# Patient Record
Sex: Male | Born: 1965
Health system: Southern US, Community
[De-identification: ages and names within clinical notes are randomized; demographics above are authoritative.]

---

## 2005-10-10 ENCOUNTER — Encounter: Admission: RE | Admit: 2005-10-10 | Discharge: 2005-10-10 | Payer: Self-pay | Admitting: Orthopedic Surgery

## 2012-10-11 ENCOUNTER — Ambulatory Visit
Admission: RE | Admit: 2012-10-11 | Discharge: 2012-10-11 | Disposition: A | Discharge status: Home / Self Care | Payer: 59 | Source: Ambulatory Visit | Attending: Family Medicine | Admitting: Family Medicine

## 2012-10-11 ENCOUNTER — Other Ambulatory Visit: Payer: Self-pay | Admitting: Family Medicine

## 2012-10-11 DIAGNOSIS — R05 Cough: Secondary | ICD-10-CM

## 2012-10-11 DIAGNOSIS — R509 Fever, unspecified: Secondary | ICD-10-CM

## 2012-10-11 DIAGNOSIS — R059 Cough, unspecified: Secondary | ICD-10-CM

## 2013-12-23 IMAGING — CR DG CHEST 2V
2 series · 2 of 2 positions shown · non-contrast
Comparison: None.

CLINICAL DATA: Cough and shortness of breath.  Right mid lung
rales.

CHEST - 2 VIEW

[w chest pa]
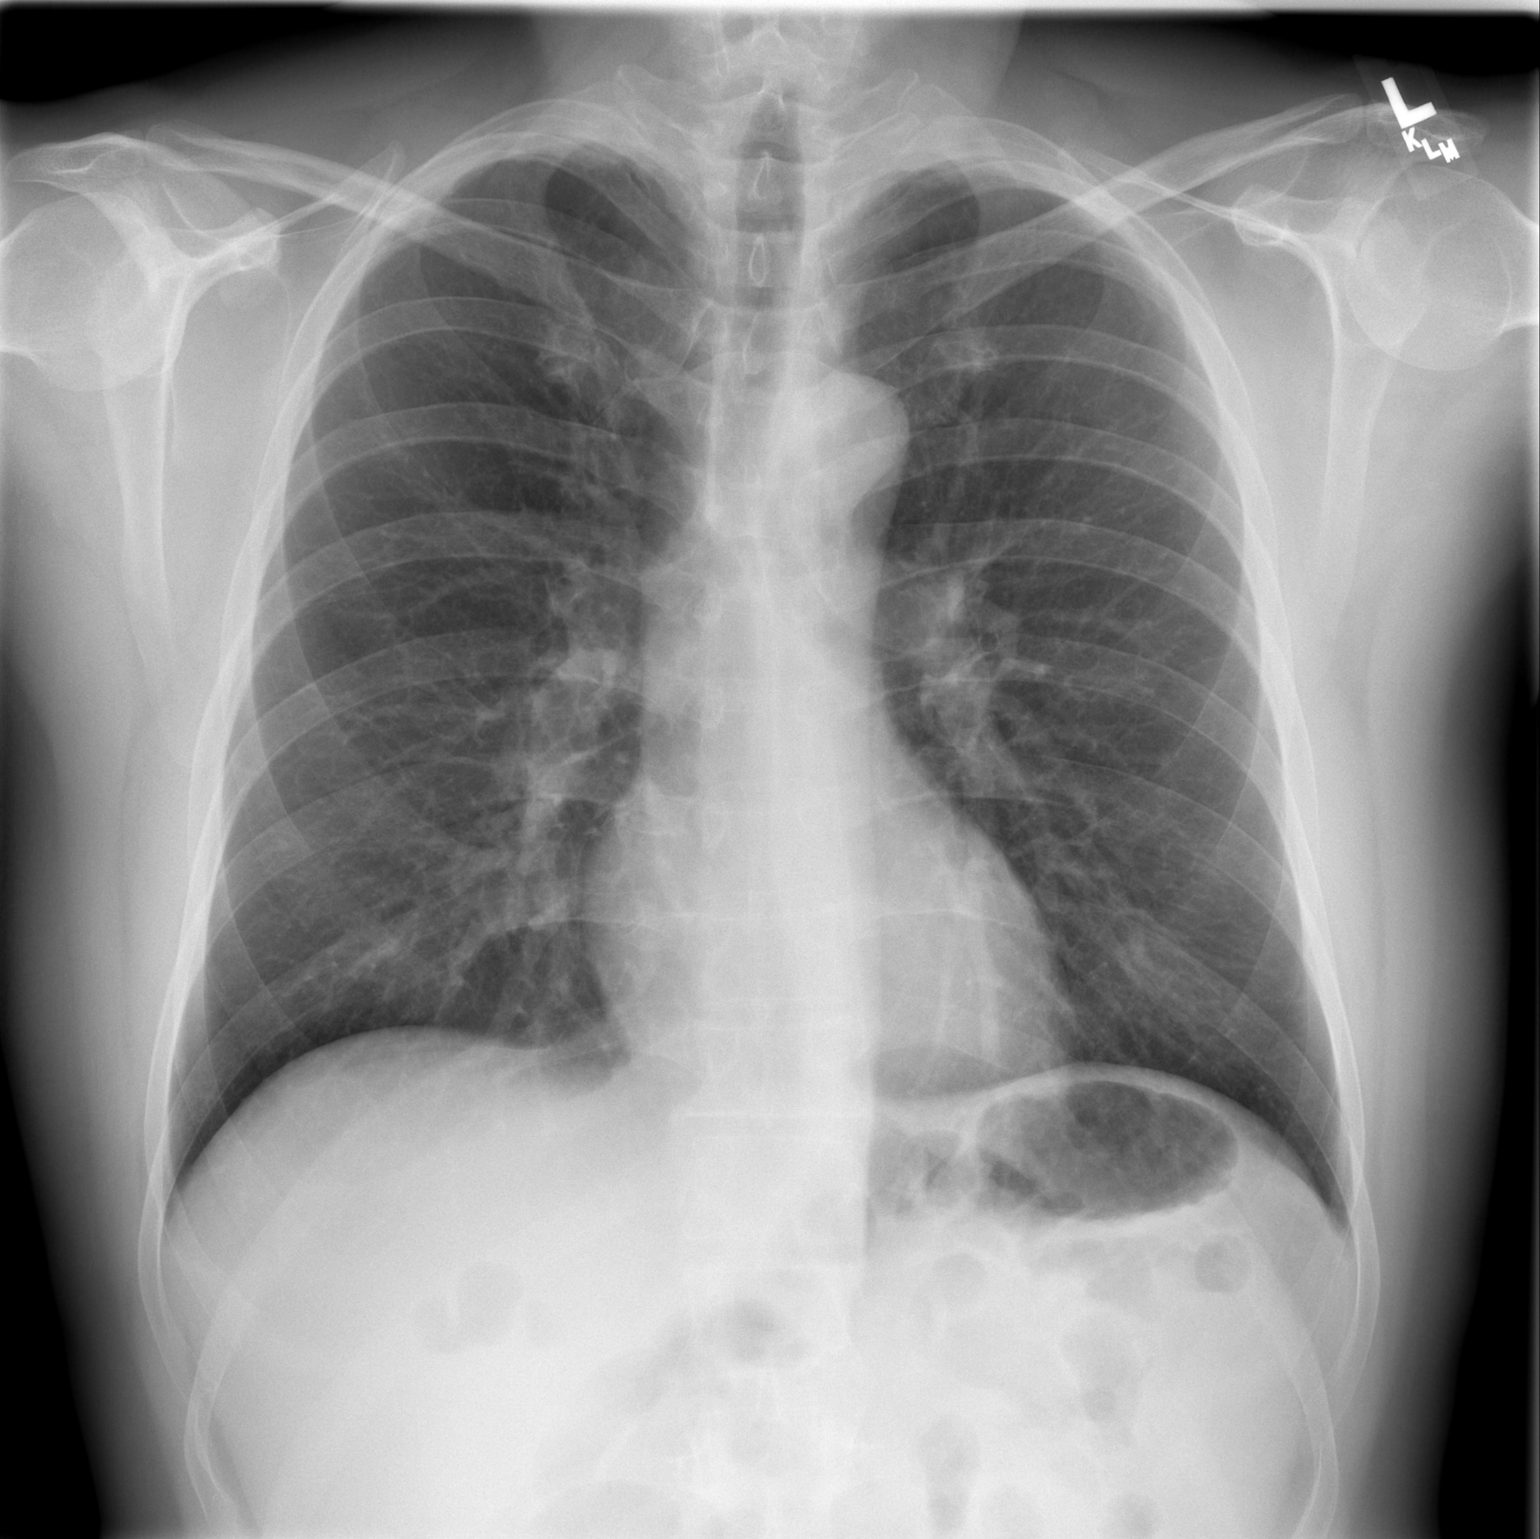

[w chest lat]
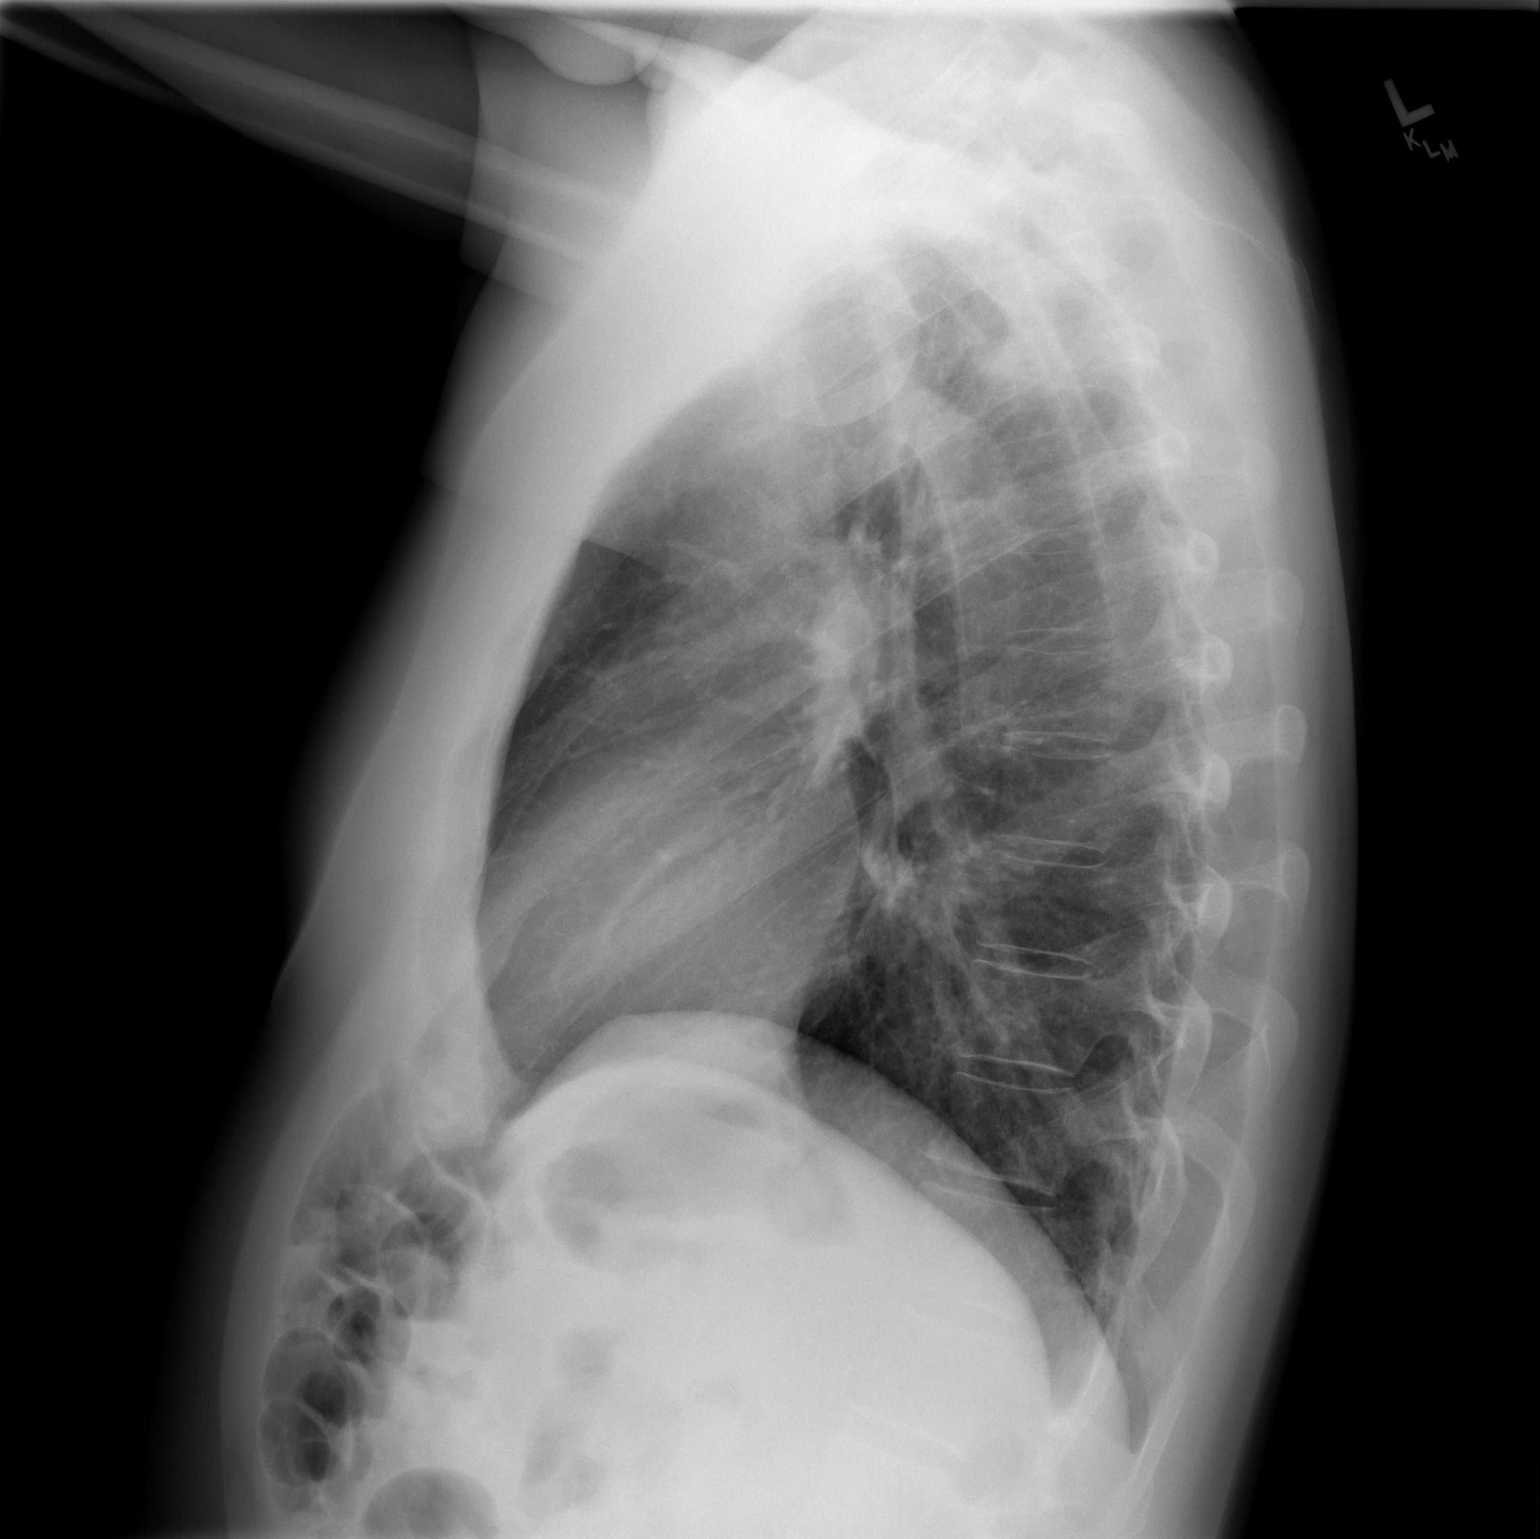

[2 of 2 positions shown; findings below may reference images not displayed]

FINDINGS: Trachea is midline.  Heart size normal.  Minimal biapical
pleural thickening.  Lungs are otherwise clear.  No pleural fluid.
IMPRESSION: No acute findings.

## 2016-06-17 DIAGNOSIS — M5416 Radiculopathy, lumbar region: Secondary | ICD-10-CM | POA: Diagnosis not present

## 2016-06-17 DIAGNOSIS — M545 Low back pain: Secondary | ICD-10-CM | POA: Diagnosis not present

## 2016-06-17 DIAGNOSIS — M5136 Other intervertebral disc degeneration, lumbar region: Secondary | ICD-10-CM | POA: Diagnosis not present

## 2016-08-07 DIAGNOSIS — H02825 Cysts of left lower eyelid: Secondary | ICD-10-CM | POA: Diagnosis not present

## 2016-11-15 DIAGNOSIS — J028 Acute pharyngitis due to other specified organisms: Secondary | ICD-10-CM | POA: Diagnosis not present

## 2016-11-15 DIAGNOSIS — J020 Streptococcal pharyngitis: Secondary | ICD-10-CM | POA: Diagnosis not present

## 2017-05-18 DIAGNOSIS — J069 Acute upper respiratory infection, unspecified: Secondary | ICD-10-CM | POA: Diagnosis not present

## 2017-05-19 DIAGNOSIS — H6123 Impacted cerumen, bilateral: Secondary | ICD-10-CM | POA: Diagnosis not present

## 2017-06-16 ENCOUNTER — Encounter (HOSPITAL_COMMUNITY): Payer: Self-pay

## 2017-06-16 ENCOUNTER — Emergency Department (HOSPITAL_COMMUNITY)
Admission: EM | Admit: 2017-06-16 | Discharge: 2017-06-16 | Disposition: A | Discharge status: Home / Self Care | Payer: 59 | Attending: Emergency Medicine | Admitting: Emergency Medicine

## 2017-06-16 DIAGNOSIS — Z79899 Other long term (current) drug therapy: Secondary | ICD-10-CM | POA: Diagnosis not present

## 2017-06-16 DIAGNOSIS — M545 Low back pain, unspecified: Secondary | ICD-10-CM

## 2017-06-16 MED ORDER — IBUPROFEN 400 MG PO TABS
600.0000 mg | ORAL_TABLET | Freq: Once | ORAL | Status: AC
  Administered 2017-06-16: 600 mg via ORAL
  Filled 2017-06-16: qty 1

## 2017-06-16 MED ORDER — METHOCARBAMOL 500 MG PO TABS
500.0000 mg | ORAL_TABLET | Freq: Once | ORAL | Status: AC
  Administered 2017-06-16: 500 mg via ORAL
  Filled 2017-06-16: qty 1

## 2017-06-16 MED ORDER — METHOCARBAMOL 500 MG PO TABS: 500.0000 mg | ORAL_TABLET | Freq: Every evening | ORAL | 0 refills | Status: AC | PRN

## 2017-06-16 MED ORDER — HYDROMORPHONE HCL 1 MG/ML IJ SOLN
1.0000 mg | Freq: Once | INTRAMUSCULAR | Status: AC
  Administered 2017-06-16: 1 mg via INTRAMUSCULAR
  Filled 2017-06-16: qty 1

## 2017-06-16 MED ORDER — HYDROCODONE-ACETAMINOPHEN 5-325 MG PO TABS: 1.0000 | ORAL_TABLET | ORAL | 0 refills | Status: AC | PRN

## 2017-06-16 NOTE — Discharge Instructions (Signed)
Take Ibuprofen three times daily for the next week. Take this medicine with food. Take pain medicine as needed Take muscle relaxer at bedtime to help you sleep. This medicine makes you drowsy so do not take before driving or work Use a heating pad for sore muscles - use for 20 minutes several times a day Return for worsening symptoms

## 2017-06-16 NOTE — ED Provider Notes (Signed)
[facility] EMERGENCY DEPARTMENT Provider Note   CSN: [id number] Arrival date & time: 06/16/17  1725     History   Chief Complaint Chief Complaint  Patient presents with  . Back Pain    HPI Anthony Burch is a 52 y.o. male who presents with low back pain. Past medical history significant for chronic back pain.  States that he has had severe lower back pain for the past 2 days.  He cannot recall an injury or inciting event.  The pain is similar to his chronic pain that he has had in the past however he has never had pain that has lasted this long or has been this intense.  He has having difficulty walking around his house and using a rolling chair to get around. He also has difficulty sitting up due to muscle spasms. No fever, syncope, trauma, unexplained weight loss, hx of cancer, loss of bowel/bladder function, saddle anesthesia, urinary retention, IVDU.  No abdominal pain or urinary symptoms.  He has been receiving care at a sports medicine clinic and has received steroid injections in the back in the past.  He has not had any surgeries on the back.  He took some hydrocodone and Robaxin however these were out of date and they did not provide relief.    HPI  History reviewed. No pertinent past medical history.  There are no active problems to display for this patient.   History reviewed. No pertinent surgical history.     Home Medications    Prior to Admission medications   Medication Sig Start Date End Date Taking? Authorizing Provider  dextroamphetamine (DEXEDRINE SPANSULE) 15 MG 24 hr capsule Take 15 mg by mouth daily. 05/27/17  Yes [provider]  HYDROcodone-acetaminophen (NORCO) 10-325 MG tablet Take 1 tablet by mouth every 8 (eight) hours as needed (for back pain).   Yes [provider]  methocarbamol (ROBAXIN) 500 MG tablet Take 500 mg by mouth every 4 (four) hours as needed for muscle spasms.   Yes [provider]    PREVIDENT 5000 BOOSTER PLUS 1.1 % PSTE APPLY A THIN RIBBON OR PEA-SIZED AMOUNT TO TOOTHBRUSH & BRUSH TEETH FOR 2 MINS DAILY 03/20/17  Yes [provider]  VYVANSE 70 MG capsule Take 70 mg by mouth daily. 05/27/17  Yes [provider]    Family History History reviewed. No pertinent family history.  Social History Social History   Tobacco Use  . Smoking status: Never Smoker  . Smokeless tobacco: Never Used  Substance Use Topics  . Alcohol use: No    Frequency: Never  . Drug use: No     Allergies   Patient has no known allergies.   Review of Systems Review of Systems  Constitutional: Negative for fever.  Gastrointestinal: Negative for abdominal pain.  Musculoskeletal: Positive for back pain, gait problem and myalgias.  Skin: Negative for wound.  Neurological: Negative for weakness and numbness.     Physical Exam Updated Vital Signs BP 129/83 (BP Location: Right Arm)   Pulse 84   Temp 98.9 F (37.2 C) (Oral)   Resp 18   SpO2 100%   Physical Exam  Constitutional: He is oriented to person, place, and time. He appears well-developed and well-nourished. No distress.  Uncomfortable appearing  HENT:  Head: Normocephalic and atraumatic.  Eyes: Conjunctivae are normal. Pupils are equal, round, and reactive to light. Right eye exhibits no discharge. Left eye exhibits no discharge. No scleral icterus.  Neck: Normal range  of motion.  Cardiovascular: Normal rate.  Pulmonary/Chest: Effort normal. No respiratory distress.  Abdominal: He exhibits no distension.  Musculoskeletal:  Back: Inspection: No masses, deformity, or rash Palpation: Lumbar midline spinal tenderness with right sided paraspinal muscle tenderness. Strength: 5/5 in lower extremities and normal plantar and dorsiflexion Sensation: Intact sensation with light touch in lower extremities bilaterally SLR: Negative seated straight leg raise Gait: Deferred   Neurological: He is alert and  oriented to person, place, and time.  Skin: Skin is warm and dry.  Psychiatric: He has a normal mood and affect. His behavior is normal.  Nursing note and vitals reviewed.    ED Treatments / Results  Labs (all labs ordered are listed, but only abnormal results are displayed) Labs Reviewed - No data to display  EKG  EKG Interpretation None       Radiology No results found.  Procedures Procedures (including critical care time)  Medications Ordered in ED Medications  HYDROmorphone (DILAUDID) injection 1 mg (1 mg Intramuscular Given 06/16/17 1847)  methocarbamol (ROBAXIN) tablet 500 mg (500 mg Oral Given 06/16/17 1847)  ibuprofen (ADVIL,MOTRIN) tablet 600 mg (600 mg Oral Given 06/16/17 1847)     Initial Impression / Assessment and Plan / ED Course  I have reviewed the triage vital signs and the nursing notes.  Pertinent labs & imaging results that were available during my care of the patient were reviewed by me and considered in my medical decision making (see chart for details).  52 year old with acute on chronic back pain.  Vital signs are normal.  He is very uncomfortable on exam.  He states the pain feels similar to prior episodes of back pain is just lasted longer and is more severe.  He has normal strength on exam.  He was given medication for pain control. He was able to ambulate in the hallway after. He asked about xrays and I explained it was not indicated for atraumatic back pain with no red flags. He verbalized understanding. He was given rx for pain medicine and muscle relaxer. He was advised to follow up with his doctor and return if worsening.  Final Clinical Impressions(s) / ED Diagnoses   Final diagnoses:  Acute midline low back pain without sciatica    ED Discharge Orders    None       Bethel BornGekas, Sheri Prows Marie, PA-C 06/16/17 2117    Nira Connardama, Pedro Eduardo, MD 06/17/17 [phone]

## 2017-06-16 NOTE — ED Triage Notes (Signed)
Pt arrived via [facility] from home c/o back pain/spasms since Sunday. Home meds not helping.

## 2017-06-16 NOTE — ED Notes (Signed)
Pt stable, ambulatory, states understanding of discharge instructions 

## 2017-06-16 NOTE — ED Notes (Signed)
Pt ambulated holding onto the counter for assistance.  States the pain and spasms feel better after the prescriptions.  Tresa EndoKelly, PA witnessed and spoke to patient.

## 2017-06-19 DIAGNOSIS — M5416 Radiculopathy, lumbar region: Secondary | ICD-10-CM | POA: Diagnosis not present

## 2017-06-19 DIAGNOSIS — M25551 Pain in right hip: Secondary | ICD-10-CM | POA: Diagnosis not present

## 2017-06-19 DIAGNOSIS — M5136 Other intervertebral disc degeneration, lumbar region: Secondary | ICD-10-CM | POA: Diagnosis not present

## 2017-07-01 DIAGNOSIS — M5416 Radiculopathy, lumbar region: Secondary | ICD-10-CM | POA: Diagnosis not present

## 2017-07-01 DIAGNOSIS — M545 Low back pain: Secondary | ICD-10-CM | POA: Diagnosis not present

## 2017-07-01 DIAGNOSIS — M5126 Other intervertebral disc displacement, lumbar region: Secondary | ICD-10-CM | POA: Diagnosis not present

## 2017-07-07 DIAGNOSIS — M545 Low back pain: Secondary | ICD-10-CM | POA: Diagnosis not present

## 2017-07-07 DIAGNOSIS — M5416 Radiculopathy, lumbar region: Secondary | ICD-10-CM | POA: Diagnosis not present

## 2017-07-07 DIAGNOSIS — M5126 Other intervertebral disc displacement, lumbar region: Secondary | ICD-10-CM | POA: Diagnosis not present

## 2017-07-08 DIAGNOSIS — M5126 Other intervertebral disc displacement, lumbar region: Secondary | ICD-10-CM | POA: Diagnosis not present

## 2017-07-10 DIAGNOSIS — M5416 Radiculopathy, lumbar region: Secondary | ICD-10-CM | POA: Diagnosis not present

## 2017-07-10 DIAGNOSIS — M5126 Other intervertebral disc displacement, lumbar region: Secondary | ICD-10-CM | POA: Diagnosis not present

## 2017-07-10 DIAGNOSIS — M545 Low back pain: Secondary | ICD-10-CM | POA: Diagnosis not present

## 2017-07-14 DIAGNOSIS — M545 Low back pain: Secondary | ICD-10-CM | POA: Diagnosis not present

## 2017-07-14 DIAGNOSIS — M5416 Radiculopathy, lumbar region: Secondary | ICD-10-CM | POA: Diagnosis not present

## 2017-07-14 DIAGNOSIS — M5126 Other intervertebral disc displacement, lumbar region: Secondary | ICD-10-CM | POA: Diagnosis not present

## 2017-07-17 DIAGNOSIS — M5126 Other intervertebral disc displacement, lumbar region: Secondary | ICD-10-CM | POA: Diagnosis not present

## 2017-07-17 DIAGNOSIS — M545 Low back pain: Secondary | ICD-10-CM | POA: Diagnosis not present

## 2017-07-17 DIAGNOSIS — M5416 Radiculopathy, lumbar region: Secondary | ICD-10-CM | POA: Diagnosis not present

## 2018-02-26 DIAGNOSIS — H1045 Other chronic allergic conjunctivitis: Secondary | ICD-10-CM | POA: Diagnosis not present

## 2018-06-09 DIAGNOSIS — R229 Localized swelling, mass and lump, unspecified: Secondary | ICD-10-CM | POA: Diagnosis not present

## 2018-07-24 DIAGNOSIS — F900 Attention-deficit hyperactivity disorder, predominantly inattentive type: Secondary | ICD-10-CM | POA: Diagnosis not present

## 2018-10-01 DIAGNOSIS — D1801 Hemangioma of skin and subcutaneous tissue: Secondary | ICD-10-CM | POA: Diagnosis not present

## 2018-10-01 DIAGNOSIS — L814 Other melanin hyperpigmentation: Secondary | ICD-10-CM | POA: Diagnosis not present

## 2018-10-01 DIAGNOSIS — D229 Melanocytic nevi, unspecified: Secondary | ICD-10-CM | POA: Diagnosis not present

## 2018-10-02 DIAGNOSIS — M545 Low back pain: Secondary | ICD-10-CM | POA: Diagnosis not present

## 2018-11-02 DIAGNOSIS — M545 Low back pain: Secondary | ICD-10-CM | POA: Diagnosis not present

## 2019-02-26 DIAGNOSIS — F900 Attention-deficit hyperactivity disorder, predominantly inattentive type: Secondary | ICD-10-CM | POA: Diagnosis not present

## 2019-04-08 DIAGNOSIS — H6123 Impacted cerumen, bilateral: Secondary | ICD-10-CM | POA: Diagnosis not present

## 2019-04-20 DIAGNOSIS — D179 Benign lipomatous neoplasm, unspecified: Secondary | ICD-10-CM | POA: Diagnosis not present

## 2019-06-21 DIAGNOSIS — H04123 Dry eye syndrome of bilateral lacrimal glands: Secondary | ICD-10-CM | POA: Diagnosis not present

## 2019-08-20 DIAGNOSIS — F900 Attention-deficit hyperactivity disorder, predominantly inattentive type: Secondary | ICD-10-CM | POA: Diagnosis not present

## 2020-02-18 DIAGNOSIS — F900 Attention-deficit hyperactivity disorder, predominantly inattentive type: Secondary | ICD-10-CM | POA: Diagnosis not present

## 2020-03-03 DIAGNOSIS — M546 Pain in thoracic spine: Secondary | ICD-10-CM | POA: Diagnosis not present

## 2020-04-04 DIAGNOSIS — M722 Plantar fascial fibromatosis: Secondary | ICD-10-CM | POA: Diagnosis not present

## 2020-05-08 DIAGNOSIS — M5136 Other intervertebral disc degeneration, lumbar region: Secondary | ICD-10-CM | POA: Diagnosis not present

## 2020-05-08 DIAGNOSIS — M545 Low back pain, unspecified: Secondary | ICD-10-CM | POA: Diagnosis not present

## 2020-05-11 DIAGNOSIS — H6123 Impacted cerumen, bilateral: Secondary | ICD-10-CM | POA: Diagnosis not present

## 2020-05-22 DIAGNOSIS — Z03818 Encounter for observation for suspected exposure to other biological agents ruled out: Secondary | ICD-10-CM | POA: Diagnosis not present

## 2020-06-26 DIAGNOSIS — H04123 Dry eye syndrome of bilateral lacrimal glands: Secondary | ICD-10-CM | POA: Diagnosis not present

## 2020-06-26 DIAGNOSIS — H5213 Myopia, bilateral: Secondary | ICD-10-CM | POA: Diagnosis not present

## 2020-08-10 DIAGNOSIS — F900 Attention-deficit hyperactivity disorder, predominantly inattentive type: Secondary | ICD-10-CM | POA: Diagnosis not present

## 2021-01-27 DIAGNOSIS — J029 Acute pharyngitis, unspecified: Secondary | ICD-10-CM | POA: Diagnosis not present

## 2021-02-01 DIAGNOSIS — F900 Attention-deficit hyperactivity disorder, predominantly inattentive type: Secondary | ICD-10-CM | POA: Diagnosis not present

## 2021-02-01 DIAGNOSIS — J208 Acute bronchitis due to other specified organisms: Secondary | ICD-10-CM | POA: Diagnosis not present

## 2021-02-05 DIAGNOSIS — H6983 Other specified disorders of Eustachian tube, bilateral: Secondary | ICD-10-CM | POA: Diagnosis not present

## 2021-04-16 DIAGNOSIS — H6123 Impacted cerumen, bilateral: Secondary | ICD-10-CM | POA: Diagnosis not present

## 2021-05-07 DIAGNOSIS — J029 Acute pharyngitis, unspecified: Secondary | ICD-10-CM | POA: Diagnosis not present

## 2021-06-21 DIAGNOSIS — J069 Acute upper respiratory infection, unspecified: Secondary | ICD-10-CM | POA: Diagnosis not present

## 2021-06-21 DIAGNOSIS — R058 Other specified cough: Secondary | ICD-10-CM | POA: Diagnosis not present

## 2021-06-21 DIAGNOSIS — Z03818 Encounter for observation for suspected exposure to other biological agents ruled out: Secondary | ICD-10-CM | POA: Diagnosis not present

## 2021-07-11 DIAGNOSIS — H04123 Dry eye syndrome of bilateral lacrimal glands: Secondary | ICD-10-CM | POA: Diagnosis not present

## 2021-07-25 DIAGNOSIS — F900 Attention-deficit hyperactivity disorder, predominantly inattentive type: Secondary | ICD-10-CM | POA: Diagnosis not present

## 2022-01-14 DIAGNOSIS — M5136 Other intervertebral disc degeneration, lumbar region: Secondary | ICD-10-CM | POA: Diagnosis not present

## 2022-01-23 DIAGNOSIS — F900 Attention-deficit hyperactivity disorder, predominantly inattentive type: Secondary | ICD-10-CM | POA: Diagnosis not present

## 2022-02-06 DIAGNOSIS — S39012D Strain of muscle, fascia and tendon of lower back, subsequent encounter: Secondary | ICD-10-CM | POA: Diagnosis not present

## 2022-02-17 DIAGNOSIS — S39012D Strain of muscle, fascia and tendon of lower back, subsequent encounter: Secondary | ICD-10-CM | POA: Diagnosis not present

## 2022-03-03 DIAGNOSIS — S39012D Strain of muscle, fascia and tendon of lower back, subsequent encounter: Secondary | ICD-10-CM | POA: Diagnosis not present

## 2022-03-21 DIAGNOSIS — Z0000 Encounter for general adult medical examination without abnormal findings: Secondary | ICD-10-CM | POA: Diagnosis not present

## 2022-04-16 DIAGNOSIS — H6123 Impacted cerumen, bilateral: Secondary | ICD-10-CM | POA: Diagnosis not present

## 2022-04-21 DIAGNOSIS — F988 Other specified behavioral and emotional disorders with onset usually occurring in childhood and adolescence: Secondary | ICD-10-CM | POA: Diagnosis not present

## 2022-04-21 DIAGNOSIS — R0989 Other specified symptoms and signs involving the circulatory and respiratory systems: Secondary | ICD-10-CM | POA: Diagnosis not present

## 2022-06-11 DIAGNOSIS — J00 Acute nasopharyngitis [common cold]: Secondary | ICD-10-CM | POA: Diagnosis not present

## 2022-06-12 DIAGNOSIS — J069 Acute upper respiratory infection, unspecified: Secondary | ICD-10-CM | POA: Diagnosis not present

## 2023-01-12 DIAGNOSIS — F900 Attention-deficit hyperactivity disorder, predominantly inattentive type: Secondary | ICD-10-CM | POA: Diagnosis not present

## 2023-03-09 DIAGNOSIS — Z13228 Encounter for screening for other metabolic disorders: Secondary | ICD-10-CM | POA: Diagnosis not present

## 2023-03-09 DIAGNOSIS — Z23 Encounter for immunization: Secondary | ICD-10-CM | POA: Diagnosis not present

## 2023-03-09 DIAGNOSIS — Z0000 Encounter for general adult medical examination without abnormal findings: Secondary | ICD-10-CM | POA: Diagnosis not present

## 2023-03-09 DIAGNOSIS — Z13220 Encounter for screening for lipoid disorders: Secondary | ICD-10-CM | POA: Diagnosis not present

## 2023-03-09 DIAGNOSIS — Z125 Encounter for screening for malignant neoplasm of prostate: Secondary | ICD-10-CM | POA: Diagnosis not present

## 2023-03-09 DIAGNOSIS — Z131 Encounter for screening for diabetes mellitus: Secondary | ICD-10-CM | POA: Diagnosis not present

## 2023-06-10 DIAGNOSIS — R7303 Prediabetes: Secondary | ICD-10-CM | POA: Diagnosis not present

## 2023-07-10 DIAGNOSIS — F900 Attention-deficit hyperactivity disorder, predominantly inattentive type: Secondary | ICD-10-CM | POA: Diagnosis not present

## 2023-08-30 DIAGNOSIS — R051 Acute cough: Secondary | ICD-10-CM | POA: Diagnosis not present

## 2023-09-07 DIAGNOSIS — R062 Wheezing: Secondary | ICD-10-CM | POA: Diagnosis not present

## 2023-11-17 DIAGNOSIS — M722 Plantar fascial fibromatosis: Secondary | ICD-10-CM | POA: Diagnosis not present

## 2023-11-17 DIAGNOSIS — H6123 Impacted cerumen, bilateral: Secondary | ICD-10-CM | POA: Diagnosis not present

## 2023-11-20 DIAGNOSIS — H04123 Dry eye syndrome of bilateral lacrimal glands: Secondary | ICD-10-CM | POA: Diagnosis not present

## 2023-11-20 DIAGNOSIS — H40033 Anatomical narrow angle, bilateral: Secondary | ICD-10-CM | POA: Diagnosis not present

## 2023-11-20 DIAGNOSIS — H1045 Other chronic allergic conjunctivitis: Secondary | ICD-10-CM | POA: Diagnosis not present

## 2024-01-08 DIAGNOSIS — F900 Attention-deficit hyperactivity disorder, predominantly inattentive type: Secondary | ICD-10-CM | POA: Diagnosis not present

## 2024-03-21 DIAGNOSIS — E7800 Pure hypercholesterolemia, unspecified: Secondary | ICD-10-CM | POA: Diagnosis not present

## 2024-03-21 DIAGNOSIS — Z0000 Encounter for general adult medical examination without abnormal findings: Secondary | ICD-10-CM | POA: Diagnosis not present

## 2024-03-21 DIAGNOSIS — Z23 Encounter for immunization: Secondary | ICD-10-CM | POA: Diagnosis not present

## 2024-03-21 DIAGNOSIS — R7303 Prediabetes: Secondary | ICD-10-CM | POA: Diagnosis not present
